# Patient Record
Sex: Male | Born: 1975 | Race: Black or African American | Hispanic: No | Marital: Single | Smoking: Never smoker
Health system: Southern US, Community
[De-identification: ages and names within clinical notes are randomized; demographics above are authoritative.]

## PROBLEM LIST (undated history)

## (undated) DIAGNOSIS — I10 Essential (primary) hypertension: Secondary | ICD-10-CM

## (undated) HISTORY — PX: HERNIA REPAIR: SHX51

---

## 2005-06-10 ENCOUNTER — Inpatient Hospital Stay (HOSPITAL_COMMUNITY): Admission: EM | Admit: 2005-06-10 | Discharge: 2005-06-11 | Payer: Self-pay | Admitting: Emergency Medicine

## 2005-06-11 ENCOUNTER — Ambulatory Visit: Payer: Self-pay | Admitting: Gastroenterology

## 2007-08-26 IMAGING — CT CT ANGIO CHEST
1 of 5 series · 19 of 30 positions shown · IV contrast (80 ml omni 300)
Comparison: none

CLINICAL DATA: Throwing up blood.  Chest pain and headache.  
 CT ANGIOGRAPHY OF CHEST:
TECHNIQUE: Multidetector CT imaging of the chest was performed during bolus injection of intravenous contrast.  Multiplanar CT angiographic image reconstructions were generated to evaluate the vascular anatomy.
 Initially, the study was performed with suboptimal contrast timing.  The patient was brought back to the scanner after creatinine was verified to be 1.0.  The patient was given an additional contrast bolus for the standard technique.  A total of 180 cc of Omnipaque 300 was given.  
 The vessels are well-opacified and there is no evidence for acute pulmonary embolus.  There is slight fullness to the region of the thymus but this is probably within normal limits for the patient?s age.  No mediastinal, hilar or axillary adenopathy identified.  No nodules, pleural effusions or infiltrates are identified.  Note is made of a granuloma in the left upper lobe.  Images of the upper abdomen are unremarkable.

[Series 2: pe · axial · 0.77mm/px · z∈[-327,-23]mm · 19 of 543 slices shown]
[im 28/543  lung]
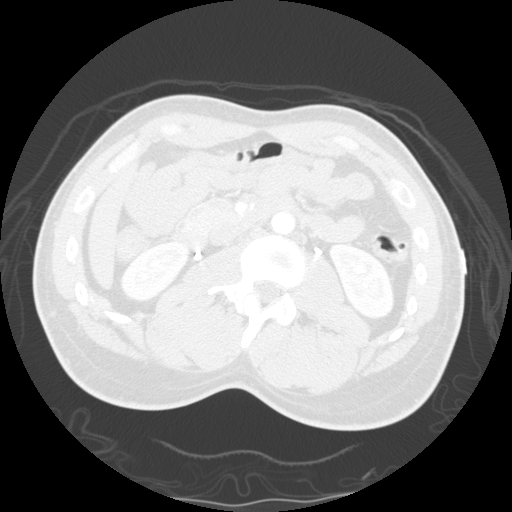
[im 55/543  mediastinal]
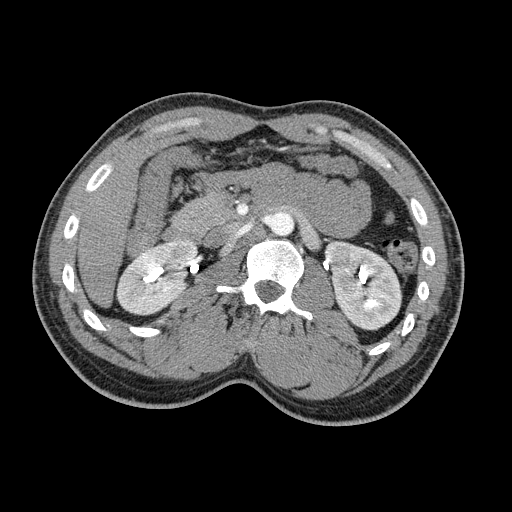
[im 82/543  lung]
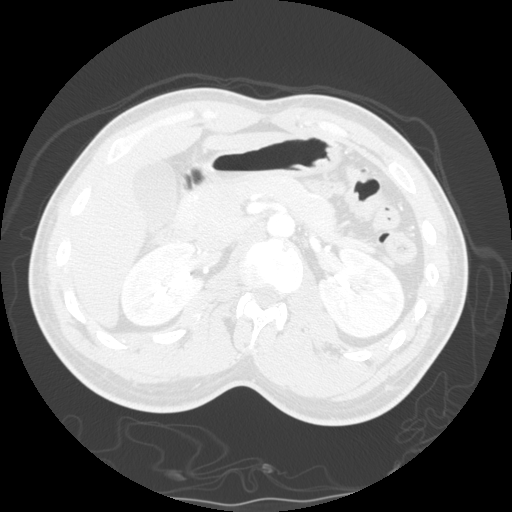
[im 109/543  mediastinal]
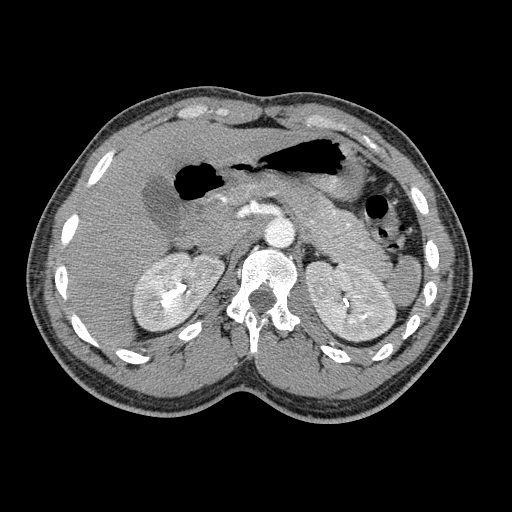
[im 136/543  lung]
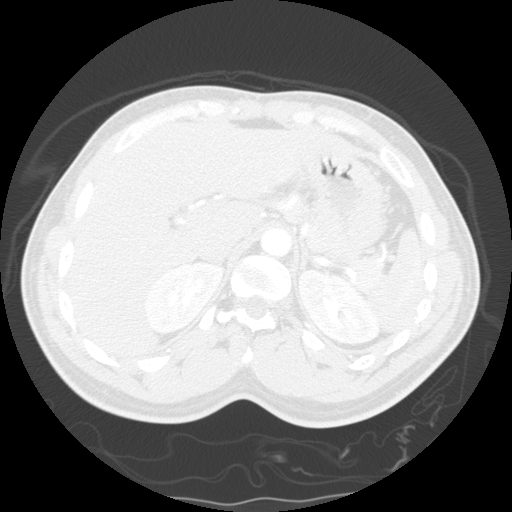
[im 163/543  mediastinal]
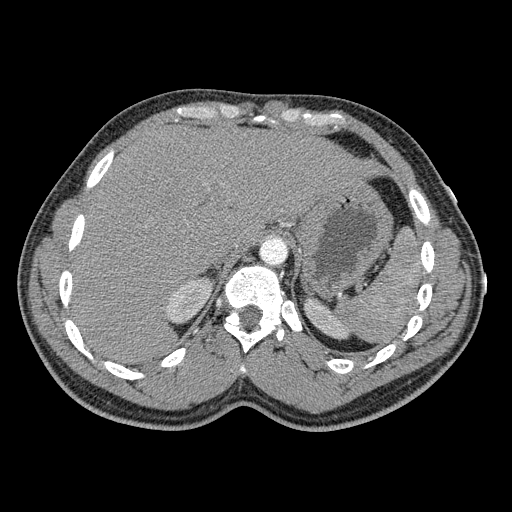
[im 190/543  lung]
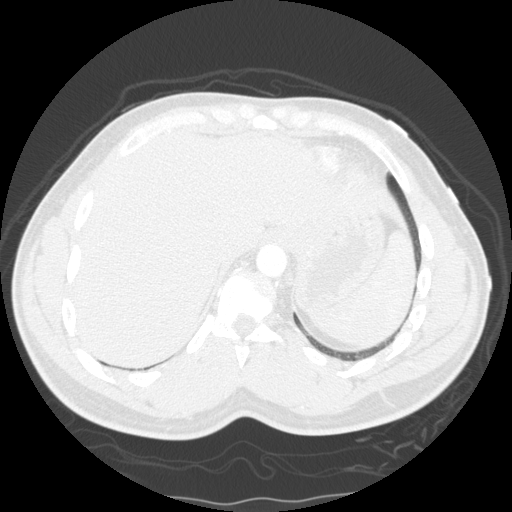
[im 217/543  mediastinal]
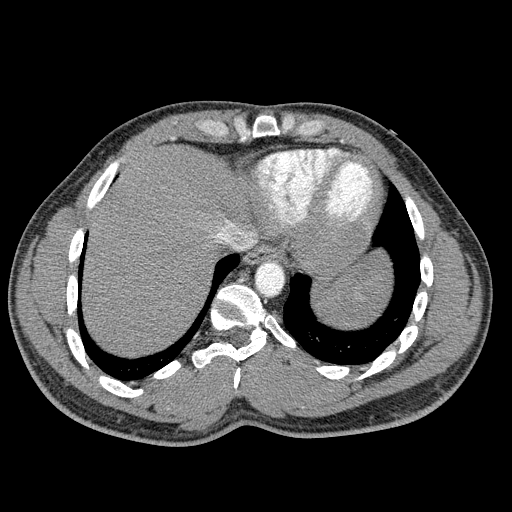
[im 244/543  lung]
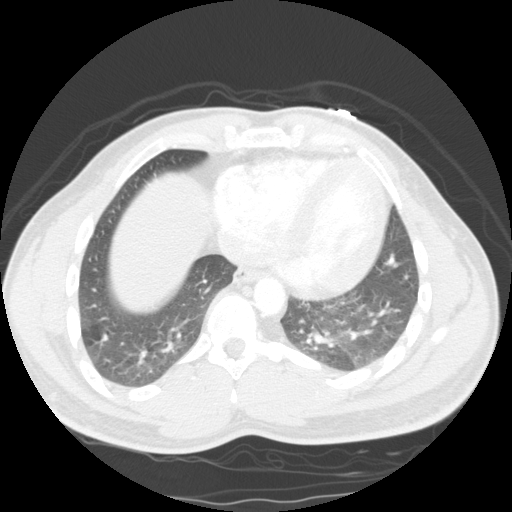
[im 272/543  mediastinal]
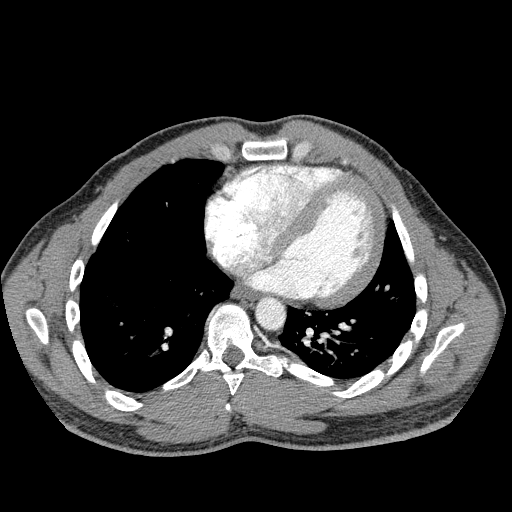
[im 299/543  lung]
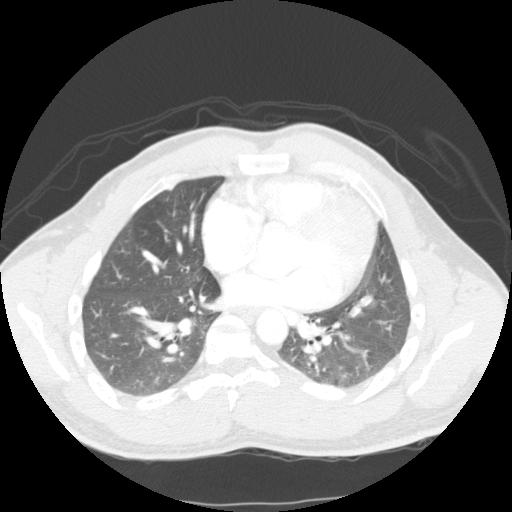
[im 326/543  mediastinal]
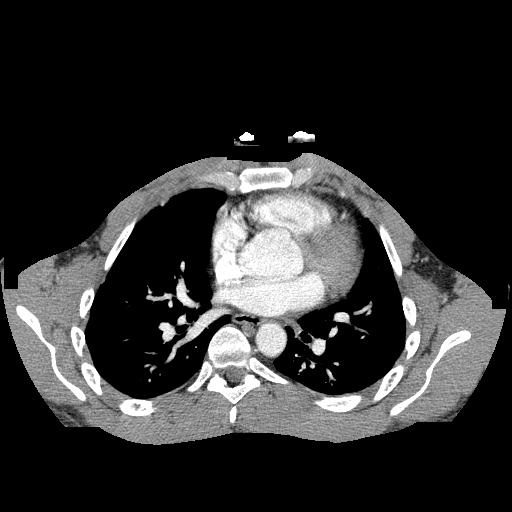
[im 353/543  lung]
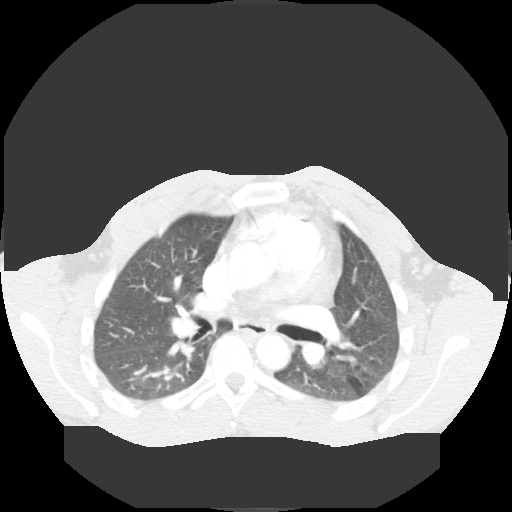
[im 380/543  mediastinal]
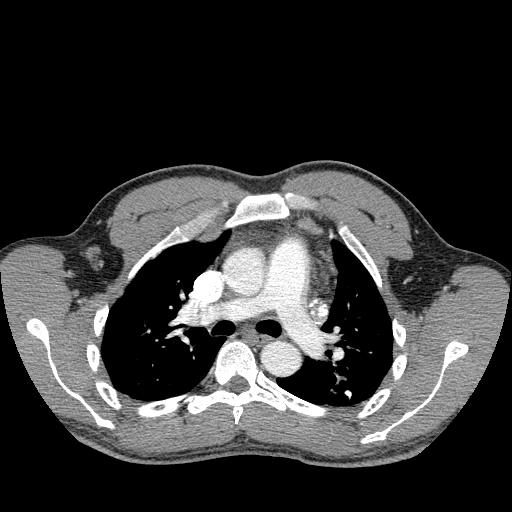
[im 407/543  lung]
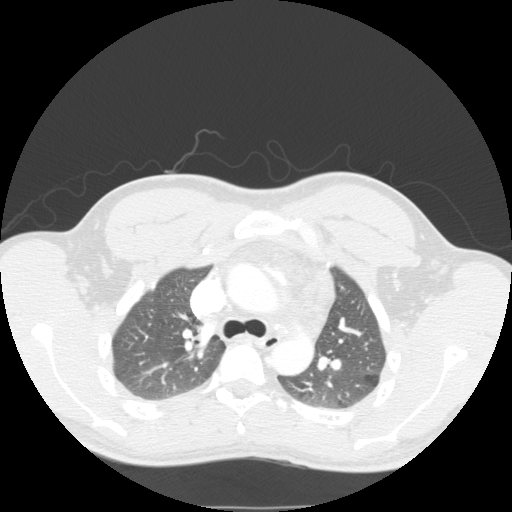
[im 434/543  mediastinal]
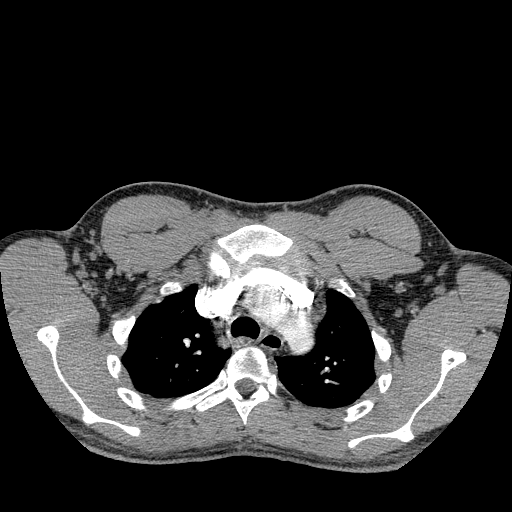
[im 461/543  lung]
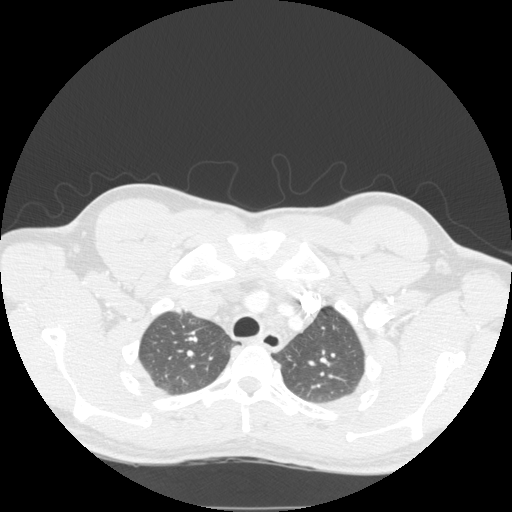
[im 488/543  mediastinal]
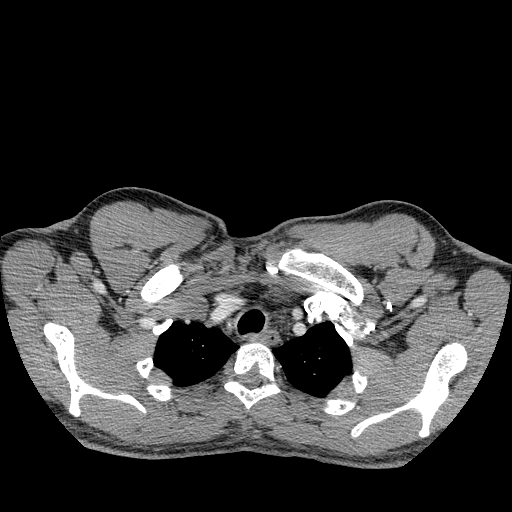
[im 515/543  lung]
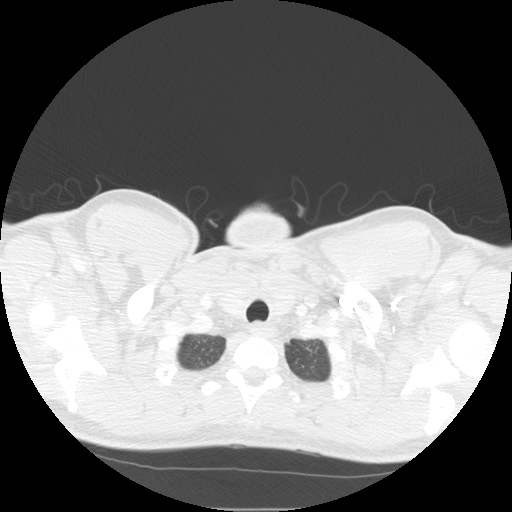

[19 of 30 positions shown; findings below may reference images not displayed]

IMPRESSION: No evidence for acute abnormality of the chest.

## 2014-01-25 ENCOUNTER — Other Ambulatory Visit (HOSPITAL_COMMUNITY): Payer: Self-pay | Admitting: Orthopedic Surgery

## 2014-01-26 ENCOUNTER — Encounter (HOSPITAL_COMMUNITY): Payer: Self-pay | Admitting: Pharmacy Technician

## 2014-01-27 ENCOUNTER — Encounter (HOSPITAL_COMMUNITY): Payer: Self-pay

## 2014-01-27 ENCOUNTER — Encounter (HOSPITAL_COMMUNITY)
Admission: RE | Admit: 2014-01-27 | Discharge: 2014-01-27 | Disposition: A | Discharge status: Home / Self Care | Payer: Worker's Compensation | Source: Ambulatory Visit | Attending: Orthopedic Surgery | Admitting: Orthopedic Surgery

## 2014-01-27 DIAGNOSIS — I10 Essential (primary) hypertension: Secondary | ICD-10-CM | POA: Diagnosis not present

## 2014-01-27 DIAGNOSIS — W1789XA Other fall from one level to another, initial encounter: Secondary | ICD-10-CM | POA: Diagnosis not present

## 2014-01-27 DIAGNOSIS — M94262 Chondromalacia, left knee: Secondary | ICD-10-CM | POA: Diagnosis not present

## 2014-01-27 DIAGNOSIS — S83512A Sprain of anterior cruciate ligament of left knee, initial encounter: Secondary | ICD-10-CM | POA: Diagnosis present

## 2014-01-27 DIAGNOSIS — S83212A Bucket-handle tear of medial meniscus, current injury, left knee, initial encounter: Secondary | ICD-10-CM | POA: Diagnosis not present

## 2014-01-27 DIAGNOSIS — Y9289 Other specified places as the place of occurrence of the external cause: Secondary | ICD-10-CM | POA: Diagnosis not present

## 2014-01-27 HISTORY — DX: Essential (primary) hypertension: I10

## 2014-01-27 LAB — CBC
HCT: 43.2 % (ref 39.0–52.0)
Hemoglobin: 14.6 g/dL (ref 13.0–17.0)
MCH: 28.9 pg (ref 26.0–34.0)
MCHC: 33.8 g/dL (ref 30.0–36.0)
MCV: 85.4 fL (ref 78.0–100.0)
Platelets: 237 10*3/uL (ref 150–400)
RBC: 5.06 MIL/uL (ref 4.22–5.81)
RDW: 13.3 % (ref 11.5–15.5)
WBC: 7.1 10*3/uL (ref 4.0–10.5)

## 2014-01-27 LAB — BASIC METABOLIC PANEL
Anion gap: 11 (ref 5–15)
BUN: 13 mg/dL (ref 6–23)
CALCIUM: 9.2 mg/dL (ref 8.4–10.5)
CO2: 25 meq/L (ref 19–32)
CREATININE: 0.96 mg/dL (ref 0.50–1.35)
Chloride: 104 mEq/L (ref 96–112)
GLUCOSE: 87 mg/dL (ref 70–99)
POTASSIUM: 4.4 meq/L (ref 3.7–5.3)
SODIUM: 140 meq/L (ref 137–147)

## 2014-01-27 MED ORDER — CHLORHEXIDINE GLUCONATE 4 % EX LIQD: 60.0000 mL | Freq: Once | CUTANEOUS | Status: DC

## 2014-01-27 MED ORDER — CEFAZOLIN SODIUM-DEXTROSE 2-3 GM-% IV SOLR
2.0000 g | INTRAVENOUS | Status: AC
  Administered 2014-01-28: 2 g via INTRAVENOUS
  Filled 2014-01-27: qty 50

## 2014-01-27 NOTE — Pre-Procedure Instructions (Signed)
Derek Brady  01/27/2014   Your procedure is scheduled on:  Thursday October 15 th at 0730 AM  [facility] at [facility]30 AM.  Call this number if you have problems the morning of surgery: [phone]   Remember:   Do not eat food or drink liquids after midnight.   Take these medicines the morning of surgery with A SIP OF WATER:  NONE   Do not wear jewelry.  Do not wear lotions, powders, or colognes. You may wear deodorant.             Men may shave face and neck.  Do not bring valuables to the hospital.  [facility] any belongings or valuables.               Contacts, dentures or bridgework may not be worn into surgery.  Leave suitcase in the car. After surgery it may be brought to your room.  For patients admitted to the hospital, discharge time is determined by your  treatment team.               Patients discharged the day of surgery will not be allowed to drive  home.    Special Instructions: [place] - Preparing for Surgery  Before surgery, you can play an important role.  Because skin is not sterile, your skin needs to be as free of germs as possible.  You can reduce the number of germs on you skin by washing with CHG (chlorahexidine gluconate) soap before surgery.  CHG is an antiseptic cleaner which kills germs and bonds with the skin to continue killing germs even after washing.  Please DO NOT use if you have an allergy to CHG or antibacterial soaps.  If your skin becomes reddened/irritated stop using the CHG and inform your nurse when you arrive at Short Stay.  Do not shave (including legs and underarms) for at least 48 hours prior to the first CHG shower.  You may shave your face.  Please follow these instructions carefully:   1.  Shower with CHG Soap the night before surgery and the                                morning of Surgery.  2.  If you choose to wash your hair, wash your hair first as usual with your        normal shampoo.  3.  After you shampoo, rinse your hair and body thoroughly to remove the                      Shampoo.  4.  Use CHG as you would any other liquid soap.  You can apply chg directly       to the skin and wash gently with scrungie or a clean washcloth.  5.  Apply the CHG Soap to your body ONLY FROM THE NECK DOWN.        Do not use on open wounds or open sores.  Avoid contact with your eyes,       ears, mouth and genitals (private parts).  Wash genitals (private parts)       with your normal soap.  6.  Wash thoroughly, paying special attention to the area where your surgery        will be performed.  7.  Thoroughly rinse your body with warm  water from the neck down.  8.  DO NOT shower/wash with your normal soap after using and rinsing off       the CHG Soap.  9.  Pat yourself dry with a clean towel.            10.  Wear clean pajamas.            11.  Place clean sheets on your bed the night of your first shower and do not        sleep with pets.  Day of Surgery  Do not apply any lotions/deoderants the morning of surgery.  Please wear clean clothes to the hospital/surgery center.      Please read over the following fact sheets that you were given: Pain Booklet, Coughing and Deep Breathing and Surgical Site Infection Prevention

## 2014-01-28 ENCOUNTER — Ambulatory Visit (HOSPITAL_COMMUNITY): Payer: Worker's Compensation | Admitting: Anesthesiology

## 2014-01-28 ENCOUNTER — Encounter (HOSPITAL_COMMUNITY)
Admission: RE | Disposition: A | Discharge status: Home / Self Care | Payer: Self-pay | Source: Ambulatory Visit | Attending: Orthopedic Surgery

## 2014-01-28 ENCOUNTER — Encounter (HOSPITAL_COMMUNITY): Payer: Self-pay | Admitting: *Deleted

## 2014-01-28 ENCOUNTER — Encounter (HOSPITAL_COMMUNITY): Payer: Worker's Compensation | Admitting: Anesthesiology

## 2014-01-28 ENCOUNTER — Observation Stay (HOSPITAL_COMMUNITY)
Admission: RE | Admit: 2014-01-28 | Discharge: 2014-01-30 | Disposition: A | Discharge status: Home / Self Care | Payer: Worker's Compensation | Source: Ambulatory Visit | Attending: Orthopedic Surgery | Admitting: Orthopedic Surgery

## 2014-01-28 DIAGNOSIS — S83212A Bucket-handle tear of medial meniscus, current injury, left knee, initial encounter: Secondary | ICD-10-CM | POA: Insufficient documentation

## 2014-01-28 DIAGNOSIS — S83512A Sprain of anterior cruciate ligament of left knee, initial encounter: Secondary | ICD-10-CM | POA: Diagnosis not present

## 2014-01-28 DIAGNOSIS — S83519A Sprain of anterior cruciate ligament of unspecified knee, initial encounter: Secondary | ICD-10-CM | POA: Diagnosis present

## 2014-01-28 DIAGNOSIS — I10 Essential (primary) hypertension: Secondary | ICD-10-CM | POA: Insufficient documentation

## 2014-01-28 DIAGNOSIS — Y9289 Other specified places as the place of occurrence of the external cause: Secondary | ICD-10-CM | POA: Insufficient documentation

## 2014-01-28 DIAGNOSIS — W1789XA Other fall from one level to another, initial encounter: Secondary | ICD-10-CM | POA: Insufficient documentation

## 2014-01-28 DIAGNOSIS — M94262 Chondromalacia, left knee: Secondary | ICD-10-CM | POA: Insufficient documentation

## 2014-01-28 HISTORY — PX: ANTERIOR CRUCIATE LIGAMENT REPAIR: SHX115

## 2014-01-28 HISTORY — PX: KNEE ARTHROSCOPY: SUR90

## 2014-01-28 SURGERY — RECONSTRUCTION, KNEE, ACL, USING HAMSTRING GRAFT
Anesthesia: General | Site: Knee | Laterality: Left

## 2014-01-28 MED ORDER — MORPHINE SULFATE 4 MG/ML IJ SOLN
INTRAMUSCULAR | Status: DC | PRN
  Administered 2014-01-28: 4 mg

## 2014-01-28 MED ORDER — PROPOFOL 10 MG/ML IV BOLUS
INTRAVENOUS | Status: DC | PRN
  Administered 2014-01-28: 200 mg via INTRAVENOUS

## 2014-01-28 MED ORDER — CLONIDINE HCL (ANALGESIA) 100 MCG/ML EP SOLN
EPIDURAL | Status: DC | PRN
  Administered 2014-01-28: 1 mL

## 2014-01-28 MED ORDER — POTASSIUM CHLORIDE IN NACL 20-0.9 MEQ/L-% IV SOLN
INTRAVENOUS | Status: AC
  Administered 2014-01-28 – 2014-01-29 (×2): via INTRAVENOUS
  Filled 2014-01-28 (×2): qty 1000

## 2014-01-28 MED ORDER — ONDANSETRON HCL 4 MG/2ML IJ SOLN
INTRAMUSCULAR | Status: DC | PRN
  Administered 2014-01-28: 4 mg via INTRAVENOUS

## 2014-01-28 MED ORDER — ONDANSETRON HCL 4 MG/2ML IJ SOLN: 4.0000 mg | Freq: Once | INTRAMUSCULAR | Status: DC | PRN

## 2014-01-28 MED ORDER — PROPOFOL 10 MG/ML IV BOLUS
INTRAVENOUS | Status: AC
  Filled 2014-01-28: qty 20

## 2014-01-28 MED ORDER — SODIUM CHLORIDE 0.9 % IR SOLN
Status: DC | PRN
  Administered 2014-01-28 (×4): 3000 mL

## 2014-01-28 MED ORDER — METOCLOPRAMIDE HCL 5 MG/ML IJ SOLN: 5.0000 mg | Freq: Three times a day (TID) | INTRAMUSCULAR | Status: DC | PRN

## 2014-01-28 MED ORDER — ESMOLOL HCL 10 MG/ML IV SOLN
INTRAVENOUS | Status: DC | PRN
  Administered 2014-01-28: 10 mg via INTRAVENOUS

## 2014-01-28 MED ORDER — OXYCODONE HCL 5 MG PO TABS: 5.0000 mg | ORAL_TABLET | Freq: Once | ORAL | Status: DC | PRN

## 2014-01-28 MED ORDER — CLONIDINE HCL (ANALGESIA) 100 MCG/ML EP SOLN
150.0000 ug | EPIDURAL | Status: DC
  Filled 2014-01-28: qty 1.5

## 2014-01-28 MED ORDER — PHENYLEPHRINE 40 MCG/ML (10ML) SYRINGE FOR IV PUSH (FOR BLOOD PRESSURE SUPPORT)
PREFILLED_SYRINGE | INTRAVENOUS | Status: AC
  Filled 2014-01-28: qty 10

## 2014-01-28 MED ORDER — CEFAZOLIN SODIUM-DEXTROSE 2-3 GM-% IV SOLR
2.0000 g | Freq: Four times a day (QID) | INTRAVENOUS | Status: AC
  Administered 2014-01-28 – 2014-01-29 (×3): 2 g via INTRAVENOUS
  Filled 2014-01-28 (×3): qty 50

## 2014-01-28 MED ORDER — METOCLOPRAMIDE HCL 10 MG PO TABS: 5.0000 mg | ORAL_TABLET | Freq: Three times a day (TID) | ORAL | Status: DC | PRN

## 2014-01-28 MED ORDER — AMLODIPINE BESYLATE 5 MG PO TABS
5.0000 mg | ORAL_TABLET | Freq: Every day | ORAL | Status: DC
  Administered 2014-01-28 – 2014-01-30 (×3): 5 mg via ORAL
  Filled 2014-01-28 (×3): qty 1

## 2014-01-28 MED ORDER — DEXTROSE 5 % IV SOLN
500.0000 mg | Freq: Four times a day (QID) | INTRAVENOUS | Status: DC | PRN
  Filled 2014-01-28: qty 5

## 2014-01-28 MED ORDER — ONDANSETRON HCL 4 MG PO TABS: 4.0000 mg | ORAL_TABLET | Freq: Four times a day (QID) | ORAL | Status: DC | PRN

## 2014-01-28 MED ORDER — MORPHINE SULFATE 4 MG/ML IJ SOLN
INTRAMUSCULAR | Status: AC
  Filled 2014-01-28: qty 1

## 2014-01-28 MED ORDER — EPHEDRINE SULFATE 50 MG/ML IJ SOLN
INTRAMUSCULAR | Status: AC
  Filled 2014-01-28: qty 1

## 2014-01-28 MED ORDER — ONDANSETRON HCL 4 MG/2ML IJ SOLN: 4.0000 mg | Freq: Four times a day (QID) | INTRAMUSCULAR | Status: DC | PRN

## 2014-01-28 MED ORDER — LABETALOL HCL 5 MG/ML IV SOLN
INTRAVENOUS | Status: AC
  Filled 2014-01-28: qty 4

## 2014-01-28 MED ORDER — SUCCINYLCHOLINE CHLORIDE 20 MG/ML IJ SOLN
INTRAMUSCULAR | Status: AC
  Filled 2014-01-28: qty 1

## 2014-01-28 MED ORDER — MIDAZOLAM HCL 2 MG/2ML IJ SOLN
INTRAMUSCULAR | Status: AC
  Filled 2014-01-28: qty 2

## 2014-01-28 MED ORDER — MIDAZOLAM HCL 5 MG/5ML IJ SOLN
INTRAMUSCULAR | Status: DC | PRN
  Administered 2014-01-28: 2 mg via INTRAVENOUS

## 2014-01-28 MED ORDER — ONDANSETRON HCL 4 MG/2ML IJ SOLN
INTRAMUSCULAR | Status: AC
  Filled 2014-01-28: qty 2

## 2014-01-28 MED ORDER — SODIUM CHLORIDE 0.9 % IJ SOLN
INTRAMUSCULAR | Status: AC
  Filled 2014-01-28: qty 10

## 2014-01-28 MED ORDER — MORPHINE SULFATE 2 MG/ML IJ SOLN
1.0000 mg | INTRAMUSCULAR | Status: DC | PRN
  Administered 2014-01-29: 1 mg via INTRAVENOUS
  Filled 2014-01-28: qty 1

## 2014-01-28 MED ORDER — 0.9 % SODIUM CHLORIDE (POUR BTL) OPTIME
TOPICAL | Status: DC | PRN
  Administered 2014-01-28: 1000 mL

## 2014-01-28 MED ORDER — ASPIRIN 325 MG PO TABS
325.0000 mg | ORAL_TABLET | Freq: Every day | ORAL | Status: DC
  Administered 2014-01-28 – 2014-01-30 (×3): 325 mg via ORAL
  Filled 2014-01-28 (×3): qty 1

## 2014-01-28 MED ORDER — FENTANYL CITRATE 0.05 MG/ML IJ SOLN
INTRAMUSCULAR | Status: AC
  Filled 2014-01-28: qty 5

## 2014-01-28 MED ORDER — BUPIVACAINE HCL (PF) 0.25 % IJ SOLN
INTRAMUSCULAR | Status: AC
  Filled 2014-01-28: qty 30

## 2014-01-28 MED ORDER — HYDROMORPHONE HCL 1 MG/ML IJ SOLN: 0.2500 mg | INTRAMUSCULAR | Status: DC | PRN

## 2014-01-28 MED ORDER — ROCURONIUM BROMIDE 50 MG/5ML IV SOLN
INTRAVENOUS | Status: AC
  Filled 2014-01-28: qty 1

## 2014-01-28 MED ORDER — HYDRALAZINE HCL 20 MG/ML IJ SOLN: 10.0000 mg | Freq: Three times a day (TID) | INTRAMUSCULAR | Status: DC | PRN

## 2014-01-28 MED ORDER — LACTATED RINGERS IV SOLN
INTRAVENOUS | Status: DC | PRN
  Administered 2014-01-28 (×2): via INTRAVENOUS

## 2014-01-28 MED ORDER — LIDOCAINE HCL (CARDIAC) 20 MG/ML IV SOLN
INTRAVENOUS | Status: AC
  Filled 2014-01-28: qty 5

## 2014-01-28 MED ORDER — FENTANYL CITRATE 0.05 MG/ML IJ SOLN
INTRAMUSCULAR | Status: DC | PRN
  Administered 2014-01-28: 25 ug via INTRAVENOUS
  Administered 2014-01-28: 100 ug via INTRAVENOUS
  Administered 2014-01-28 (×5): 25 ug via INTRAVENOUS

## 2014-01-28 MED ORDER — OXYCODONE HCL 5 MG PO TABS
5.0000 mg | ORAL_TABLET | ORAL | Status: DC | PRN
  Administered 2014-01-29: 10 mg via ORAL
  Administered 2014-01-29: 5 mg via ORAL
  Administered 2014-01-29: 10 mg via ORAL
  Administered 2014-01-29: 5 mg via ORAL
  Administered 2014-01-30 (×3): 10 mg via ORAL
  Filled 2014-01-28: qty 1
  Filled 2014-01-28 (×2): qty 2
  Filled 2014-01-28: qty 1
  Filled 2014-01-28 (×3): qty 2

## 2014-01-28 MED ORDER — LIDOCAINE HCL (CARDIAC) 20 MG/ML IV SOLN
INTRAVENOUS | Status: DC | PRN
  Administered 2014-01-28: 60 mg via INTRAVENOUS

## 2014-01-28 MED ORDER — CHLORHEXIDINE GLUCONATE 4 % EX LIQD
60.0000 mL | Freq: Once | CUTANEOUS | Status: DC
  Filled 2014-01-28: qty 60

## 2014-01-28 MED ORDER — OXYCODONE HCL 5 MG/5ML PO SOLN: 5.0000 mg | Freq: Once | ORAL | Status: DC | PRN

## 2014-01-28 MED ORDER — BUPIVACAINE HCL (PF) 0.25 % IJ SOLN
INTRAMUSCULAR | Status: DC | PRN
  Administered 2014-01-28: 15 mL

## 2014-01-28 MED ORDER — METHOCARBAMOL 500 MG PO TABS
500.0000 mg | ORAL_TABLET | Freq: Four times a day (QID) | ORAL | Status: DC | PRN
  Administered 2014-01-28 – 2014-01-30 (×4): 500 mg via ORAL
  Filled 2014-01-28 (×4): qty 1

## 2014-01-28 SURGICAL SUPPLY — 91 items
ANCHOR BUTTON TIGHTROPE ACL RT (Orthopedic Implant) ×4 IMPLANT
BANDAGE ELASTIC 6 VELCRO ST LF (GAUZE/BANDAGES/DRESSINGS) ×3 IMPLANT
BANDAGE ESMARK 6X9 LF (GAUZE/BANDAGES/DRESSINGS) ×1 IMPLANT
BIT DRILL STEP 3.5 (DRILL) ×1 IMPLANT
BLADE CUDA 5.5 (BLADE) ×2 IMPLANT
BLADE CUTTER GATOR 3.5 (BLADE) ×2 IMPLANT
BLADE GREAT WHITE 4.2 (BLADE) ×2 IMPLANT
BLADE GREAT WHITE 4.2MM (BLADE) ×1
BLADE SURG 10 STRL SS (BLADE) ×3 IMPLANT
BLADE SURG 15 STRL LF DISP TIS (BLADE) ×2 IMPLANT
BLADE SURG 15 STRL SS (BLADE) ×6
BNDG CMPR 9X6 STRL LF SNTH (GAUZE/BANDAGES/DRESSINGS) ×1
BNDG CMPR MED 10X6 ELC LF (GAUZE/BANDAGES/DRESSINGS) ×1
BNDG CMPR MED 15X6 ELC VLCR LF (GAUZE/BANDAGES/DRESSINGS) ×1
BNDG ELASTIC 6X10 VLCR STRL LF (GAUZE/BANDAGES/DRESSINGS) ×2 IMPLANT
BNDG ELASTIC 6X15 VLCR STRL LF (GAUZE/BANDAGES/DRESSINGS) ×3 IMPLANT
BNDG ESMARK 6X9 LF (GAUZE/BANDAGES/DRESSINGS) ×3
BONE MATRIX DEMINERALIZED 1CC (Bone Implant) ×4 IMPLANT
BUR OVAL 6.0 (BURR) ×3 IMPLANT
CLOSURE STERI-STRIP 1/2X4 (GAUZE/BANDAGES/DRESSINGS) ×1
CLSR STERI-STRIP ANTIMIC 1/2X4 (GAUZE/BANDAGES/DRESSINGS) ×1 IMPLANT
COVER SURGICAL LIGHT HANDLE (MISCELLANEOUS) ×3 IMPLANT
CUFF TOURNIQUET SINGLE 34IN LL (TOURNIQUET CUFF) IMPLANT
CUFF TOURNIQUET SINGLE 44IN (TOURNIQUET CUFF) IMPLANT
CUTTER FLIP 10MM (CUTTER) ×2 IMPLANT
DECANTER SPIKE VIAL GLASS SM (MISCELLANEOUS) ×1 IMPLANT
DRAPE ARTHROSCOPY W/POUCH 114 (DRAPES) ×3 IMPLANT
DRAPE INCISE IOBAN 66X45 STRL (DRAPES) ×3 IMPLANT
DRAPE U-SHAPE 47X51 STRL (DRAPES) ×3 IMPLANT
DRILL STEP 3.5 (DRILL) ×3
DRSG PAD ABDOMINAL 8X10 ST (GAUZE/BANDAGES/DRESSINGS) ×5 IMPLANT
ELECT REM PT RETURN 9FT ADLT (ELECTROSURGICAL) ×3
ELECTRODE REM PT RTRN 9FT ADLT (ELECTROSURGICAL) ×1 IMPLANT
EVACUATOR 1/8 PVC DRAIN (DRAIN) IMPLANT
FIBERSTICK 2 (SUTURE) ×2 IMPLANT
GAUZE SPONGE 4X4 12PLY STRL (GAUZE/BANDAGES/DRESSINGS) ×6 IMPLANT
GAUZE XEROFORM 1X8 LF (GAUZE/BANDAGES/DRESSINGS) ×6 IMPLANT
GLOVE BIOGEL PI IND STRL 7.5 (GLOVE) ×1 IMPLANT
GLOVE BIOGEL PI IND STRL 8 (GLOVE) ×1 IMPLANT
GLOVE BIOGEL PI INDICATOR 7.5 (GLOVE) ×2
GLOVE BIOGEL PI INDICATOR 8 (GLOVE) ×2
GLOVE ECLIPSE 7.0 STRL STRAW (GLOVE) ×3 IMPLANT
GLOVE SURG ORTHO 8.0 STRL STRW (GLOVE) ×3 IMPLANT
GOWN STRL REUS W/ TWL LRG LVL3 (GOWN DISPOSABLE) ×4 IMPLANT
GOWN STRL REUS W/ TWL XL LVL3 (GOWN DISPOSABLE) ×1 IMPLANT
GOWN STRL REUS W/TWL LRG LVL3 (GOWN DISPOSABLE) ×12
GOWN STRL REUS W/TWL XL LVL3 (GOWN DISPOSABLE) ×3
IMMOBILIZER KNEE 22 (SOFTGOODS) ×2 IMPLANT
KIT BASIN OR (CUSTOM PROCEDURE TRAY) ×3 IMPLANT
KIT ROOM TURNOVER OR (KITS) ×3 IMPLANT
MANIFOLD NEPTUNE II (INSTRUMENTS) ×3 IMPLANT
NDL 18GX1X1/2 (RX/OR ONLY) (NEEDLE) ×1 IMPLANT
NEEDLE 18GX1X1/2 (RX/OR ONLY) (NEEDLE) ×3 IMPLANT
NS IRRIG 1000ML POUR BTL (IV SOLUTION) ×3 IMPLANT
PACK ARTHROSCOPY DSU (CUSTOM PROCEDURE TRAY) ×3 IMPLANT
PAD ARMBOARD 7.5X6 YLW CONV (MISCELLANEOUS) ×6 IMPLANT
PAD CAST 4YDX4 CTTN HI CHSV (CAST SUPPLIES) ×1 IMPLANT
PADDING CAST COTTON 4X4 STRL (CAST SUPPLIES) ×3
PADDING CAST COTTON 6X4 STRL (CAST SUPPLIES) ×5 IMPLANT
PENCIL BUTTON HOLSTER BLD 10FT (ELECTRODE) ×3 IMPLANT
REAMER C 10MM (INSTRUMENTS) IMPLANT
SET ARTHROSCOPY TUBING (MISCELLANEOUS) ×3
SET ARTHROSCOPY TUBING LN (MISCELLANEOUS) ×1 IMPLANT
SPONGE GAUZE 4X4 12PLY STER LF (GAUZE/BANDAGES/DRESSINGS) ×2 IMPLANT
SPONGE LAP 4X18 X RAY DECT (DISPOSABLE) ×6 IMPLANT
SPONGE SCRUB IODOPHOR (GAUZE/BANDAGES/DRESSINGS) ×3 IMPLANT
SUCTION FRAZIER TIP 10 FR DISP (SUCTIONS) ×3 IMPLANT
SUT 2 FIBERLOOP 20 STRT BLUE (SUTURE) ×3
SUT ETHILON 3 0 PS 1 (SUTURE) ×8 IMPLANT
SUT FIBERWIRE #2 38 T-5 BLUE (SUTURE) ×9
SUT MENISCAL KIT (KITS) IMPLANT
SUT PROLENE 3 0 PS 2 (SUTURE) ×3 IMPLANT
SUT VIC AB 0 CT1 27 (SUTURE) ×9
SUT VIC AB 0 CT1 27XBRD ANBCTR (SUTURE) ×1 IMPLANT
SUT VIC AB 2-0 CT1 27 (SUTURE) ×9
SUT VIC AB 2-0 CT1 TAPERPNT 27 (SUTURE) ×1 IMPLANT
SUT VICRYL 0 TIES 12 18 (SUTURE) IMPLANT
SUTURE 2 FIBERLOOP 20 STRT BLU (SUTURE) ×3 IMPLANT
SUTURE FIBERWR #2 38 T-5 BLUE (SUTURE) ×1 IMPLANT
SUTURE TIGERSTICK 2 TIGERWIR 2 (MISCELLANEOUS) ×1 IMPLANT
SYR 30ML LL (SYRINGE) ×3 IMPLANT
SYR 30ML SLIP (SYRINGE) ×3 IMPLANT
SYR BULB IRRIGATION 50ML (SYRINGE) ×3 IMPLANT
SYR TB 1ML LUER SLIP (SYRINGE) ×3 IMPLANT
TIGERSTICK 2 TIGERWIRE 2 (MISCELLANEOUS) ×3
TOWEL OR 17X24 6PK STRL BLUE (TOWEL DISPOSABLE) ×3 IMPLANT
TOWEL OR 17X26 10 PK STRL BLUE (TOWEL DISPOSABLE) ×3 IMPLANT
UNDERPAD 30X30 INCONTINENT (UNDERPADS AND DIAPERS) ×3 IMPLANT
WAND HAND CNTRL MULTIVAC 90 (MISCELLANEOUS) ×1 IMPLANT
WATER STERILE IRR 1000ML POUR (IV SOLUTION) ×1 IMPLANT
WRAP KNEE MAXI GEL POST OP (GAUZE/BANDAGES/DRESSINGS) ×2 IMPLANT

## 2014-01-28 NOTE — Anesthesia Procedure Notes (Signed)
Procedure Name: LMA Insertion Date/Time: 01/28/2014 7:42 AM Performed by: Jerilee HohMUMM, Spencer Cardinal N Pre-anesthesia Checklist: Patient identified, Emergency Drugs available, Suction available and Patient being monitored Patient Re-evaluated:Patient Re-evaluated prior to inductionOxygen Delivery Method: Circle system utilized Preoxygenation: Pre-oxygenation with 100% oxygen Intubation Type: IV induction Ventilation: Mask ventilation without difficulty LMA: LMA inserted LMA Size: 5.0 Tube type: Oral Number of attempts: 1 Placement Confirmation: positive ETCO2 and breath sounds checked- equal and bilateral Tube secured with: Tape Dental Injury: Teeth and Oropharynx as per pre-operative assessment

## 2014-01-28 NOTE — Progress Notes (Signed)
Orthopedic Tech Progress Note Patient Details:  Derek Brady 05/04/1975 [id number]  Patient ID: Derek Brady, male   DOB: 09/16/1975, 38 y.o.   MRN: [id number] Placed pt's lle in cpm @ 0-45 degrees @ 1325  Rawad Bochicchio 01/28/2014, 3:35 PM

## 2014-01-28 NOTE — Consult Note (Signed)
[facility] Medical Consultation  Paulita CradleGary Huot YNW:[id number]:[id number] DOB: 12/12/1975 DOA: 01/28/2014 PCP: Pcp Not In System   Requesting physician: Dr Dorene GrebeScott Dean.  Date of consultation: 01-28-2014 Reason for consultation: HTN.   Impression/Recommendations Active Problems:   ACL tear   HTN (hypertension), benign    1. HTN; new diagnosis for patient. He denies prior history of HTN. His BP on admission was found to be elevated at 152/111. He was taking aleve for pain. I have advised him not to take alive. I will start low dose Norvasc starting 10-16. His BP is very high to be only related to pain.  Will order PRN hydralazine. He received clonidine today in the OR. He will need close follow up with PCP and further assess need for BP medications. Will ask CM to arrange for a PCP.  2. Left Knee ACL tear , medial meniscal tear ; SP surgery on 10/15. DVT prophylaxis per ortho.   I will followup again tomorrow. Please contact me if I can be of assistance in the meanwhile. Thank you for this consultation.  Chief Complaint: left knee pain.   HPI: 38 year old with no significant PMH presents for elective surgery of left knee ACL tear and medial meniscal tear. Patient was found to have elevated BP in the 150/110 range. He denies chest pain, dyspnea. No abdominal pain. He has been taking aleve for pain since he fell. He denies prior history of HTN.    Review of Systems:  Negative, except as per HPI.   Past Medical History  Diagnosis Date  . Hypertension     BP elevated because of pain   Past Surgical History  Procedure Laterality Date  . Hernia repair      umbilical as a child   Social History:  reports that he has never smoked. He has never used smokeless tobacco. He reports that he does not drink alcohol or use illicit drugs.  No Known Allergies Family history: father died of lung cancer.   Prior to Admission medications   Medication Sig Start Date End Date Taking? Authorizing Provider   naproxen sodium (ANAPROX) 220 MG tablet Take 440 mg by mouth 2 (two) times daily as needed (for pain).   Yes Historical Provider, MD   Physical Exam: Blood pressure 147/91, pulse 84, temperature 97.6 F (36.4 C), temperature source Oral, resp. rate 23, height 6' 2.5" (1.892 m), weight 117.028 kg (258 lb), SpO2 98.00%. Filed Vitals:   01/28/14 1315  BP:   Pulse: 84  Temp: 97.6 F (36.4 C)  Resp: 23   General:  Appears calm and comfortable Eyes: PERRL, normal lids, irises & conjunctiva ENT: grossly normal hearing, lips & tongue Neck: no LAD, masses or thyromegaly Cardiovascular: RRR, no m/r/g. No LE edema. Respiratory: CTA bilaterally, no w/r/r. Normal respiratory effort. Abdomen: soft, ntnd Skin: no rash or induration seen on limited exam Musculoskeletal: left extremity with dressing  Psychiatric: grossly normal mood and affect, speech fluent and appropriate Neurologic: grossly non-focal.    Labs on Admission:  Basic Metabolic Panel:  Recent Labs Lab 01/27/14 1243  NA 140  K 4.4  CL 104  CO2 25  GLUCOSE 87  BUN 13  CREATININE 0.96  CALCIUM 9.2   Liver Function Tests: No results found for this basename: AST, ALT, ALKPHOS, BILITOT, PROT, ALBUMIN,  in the last 168 hours No results found for this basename: LIPASE, AMYLASE,  in the last 168 hours No results found for this basename: AMMONIA,  in the  last 168 hours CBC:  Recent Labs Lab 01/27/14 1243  WBC 7.1  HGB 14.6  HCT 43.2  MCV 85.4  PLT 237   Cardiac Enzymes: No results found for this basename: CKTOTAL, CKMB, CKMBINDEX, TROPONINI,  in the last 168 hours BNP: No components found with this basename: POCBNP,  CBG: No results found for this basename: GLUCAP,  in the last 168 hours  Radiological Exams on Admission: No results found.  EKG: Independently reviewed.   Time spent: 75 minutes.   Hartley BarefootRegalado, Andrus Sharp A [facility] Pager [phone]  If 7PM-7AM, please contact  night-coverage [email or web address] Password [email or web address] 01/28/2014, 4:58 PM

## 2014-01-28 NOTE — Anesthesia Postprocedure Evaluation (Signed)
  Anesthesia Post-op Note  Patient: Derek CradleGary Brady  Procedure(s) Performed: Procedure(s) with comments: LEFT KNEE ARTHROSCOPY, ANTERIOR CRUCIATE LIGAMENT RECONSTRUCTION WITH HAMSTRING AUTOGRAFT, MEDIAL MENISCAL REPAIR VERSES DEBRIDEMENT. (Left) - LEFT KNEE ARTHROSCOPY, ANTERIOR CRUCIATE LIGAMENT RECONSTRUCTION WITH HAMSTRING AUTOGRAFT, MEDIAL MENISCAL REPAIR VERSES DEBRIDEMENT.  Patient Location: PACU  Anesthesia Type:General and GA combined with regional for post-op pain  Level of Consciousness: awake, alert  and oriented  Airway and Oxygen Therapy: Patient Spontanous Breathing and Patient connected to nasal cannula oxygen  Post-op Pain: mild  Post-op Assessment: Post-op Vital signs reviewed, Patient's Cardiovascular Status Stable, Respiratory Function Stable, Patent Airway and Pain level controlled  Post-op Vital Signs: stable  Last Vitals:  Filed Vitals:   01/28/14 1315  BP:   Pulse: 84  Temp:   Resp: 23    Complications: No apparent anesthesia complications

## 2014-01-28 NOTE — Op Note (Signed)
NAME:  Derek Brady, Derek Brady                 ACCOUNT NO.:  [id number]  MEDICAL RECORD NO.:  [id number]  LOCATION:  [id number]                        FACILITY:  [facility]  PHYSICIAN:  Burnard BuntingG. Scott Dean, M.D.    DATE OF BIRTH:  05-Nov-1975  DATE OF PROCEDURE: DATE OF DISCHARGE:                              OPERATIVE REPORT   PREOPERATIVE DIAGNOSES:  Left knee ACL tear, medial meniscal tear.  POSTOPERATIVE DIAGNOSES:  Left knee ACL tear, medial meniscal tear.  PROCEDURE:  Left knee ACL reconstruction, hamstring autograft, size 10, partial medial meniscectomy.  SURGEON:  Burnard BuntingG. Scott Dean, M.D.  ASSISTANT:  Skip MayerBlair Roberts, PA.  ANESTHESIA:  General.  INDICATIONS:  Derek Brady is a patient with left knee pain, locked meniscal tear, presents for operative management of his meniscal tear and ACL tear after explanation of risks and benefits.  OPERATIVE FINDINGS: 1. Examination under anesthesia.  He has a range of motion about 5-135     degrees and stability in varus-valgus stress.  ACL out.  PCL     intact.  No posterolateral rotatory instability is noted. 2. Diagnostic arthroscopy.     a.     Intact patellofemoral compartment.     b.     Intact lateral compartment, articular cartilage, and      meniscus.     c.     Torn ACL, intact PCL.     d.     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      The patient also had an irreparable bucket handle type tear of the      medial meniscus which was flipped into the front part of the      joint.  The meniscus has actually already started to heal in its      anterior portion to the anterior portion of the central tibial      plateau.  PROCEDURE IN DETAIL:  The patient was brought to the operating room where general anesthesia was induced.  Preoperative antibiotics were administered.  Time-out was called.  Left leg was examined under anesthesia.  Leg was prescrubbed with alcohol and  Betadine, allowed to air dry, prepped with DuraPrep solution and draped in sterile manner. Ioban used to cover the operative field.  Time-out was called.  Leg was elevated and exsanguinated with an Esmarch wrap.  Tourniquet was inflated.  Anterior inferolateral was created.  Anterior, inferior, medial portals were created in direct visualization.  Diagnostic arthroscopy was performed.  The patient had an irreparable medial meniscal tear which was debrided back to a stable rim.  All about 60% of the meniscus was affected.  Following meniscal debridement, joint surface was inspected.  The medial femoral condyle did have an area of degenerative chondromalacia grade 3, transitioning to grade 4 of about 20% of the weightbearing surface area of the medial femoral condyle. Following meniscal debridement and chondral debridement, the chondral lesion was not a traumatic lesion, more degenerative in nature.  Not felt to be really amenable to microfracture.  At this time, following meniscal debridement, ACL  stump debrided.  Notchplasty performed. Lateral compartment was inspected and found to be intact.  The patellofemoral joint was also intact.  At this time, the graft was prepared, the hamstring graft was harvested which was done actually prior to arthroscopic intervention.  Hamstring was prepared on the back table using double EndoButton technique by Skip MayerBlair Roberts.  Graft was size 10.  Following graft harvest and preparation, 10 mm tunnel drilled in the 3 o'clock position on the lateral femoral condyle.  Tibial tunnel was then also drilled using 10 mm flip cutter.  Graft was then passed and secured into tunnels, filled with StimuBlast, tightened in extension.  Excellent graft stability was achieved.  Good fixation was present.  Knee joint thoroughly irrigated. Tourniquet was released.  Solution of Marcaine, morphine, clonidine injected into the knee.  The incision was then closed using  a combination of interrupted, inverted 0 Vicryl suture, 2-0 Vicryl suture, and either 3-0 Prolene or 3-0 nylon.  Bulky knee dressing applied.  The patient tolerated the procedure well without any immediate complication, transferred to recovery room in stable condition.  Carlena SaxBlair Roberts's assistance required at all times for preparation, opening, closing, drilling the tunnels.  His assistance was medical necessity.     Burnard BuntingG. Scott Dean, M.D.     GSD/MEDQ  D:  01/28/2014  T:  01/28/2014  Job:  [id number]

## 2014-01-28 NOTE — Transfer of Care (Signed)
Immediate Anesthesia Transfer of Care Note  Patient: Derek Brady  Procedure(s) Performed: Procedure(s) with comments: LEFT KNEE ARTHROSCOPY, ANTERIOR CRUCIATE LIGAMENT RECONSTRUCTION WITH HAMSTRING AUTOGRAFT, MEDIAL MENISCAL REPAIR VERSES DEBRIDEMENT. (Left) - LEFT KNEE ARTHROSCOPY, ANTERIOR CRUCIATE LIGAMENT RECONSTRUCTION WITH HAMSTRING AUTOGRAFT, MEDIAL MENISCAL REPAIR VERSES DEBRIDEMENT.  Patient Location: PACU  Anesthesia Type:GA combined with regional for post-op pain  Level of Consciousness: awake, alert , oriented and patient cooperative  Airway & Oxygen Therapy: Patient Spontanous Breathing and Patient connected to face mask oxygen  Post-op Assessment: Report given to PACU RN, Post -op Vital signs reviewed and stable and Patient moving all extremities  Post vital signs: Reviewed and stable  Complications: No apparent anesthesia complications

## 2014-01-28 NOTE — Progress Notes (Addendum)
Flow manager number called for consult for pt's BP [phone] paged out to Dr Sunnie Nielsenegalado

## 2014-01-28 NOTE — Evaluation (Signed)
Physical Therapy Evaluation Patient Details Name: Derek Brady MRN: [id number] DOB: 06/03/1975 Today's Date: 01/28/2014   History of Present Illness  38 y.o. male s/p LEFT KNEE ARTHROSCOPY, ANTERIOR CRUCIATE LIGAMENT RECONSTRUCTION WITH HAMSTRING AUTOGRAFT, MEDIAL MENISCAL REPAIR VERSES DEBRIDEMENT.  hx of HTN.  Clinical Impression  Patient is seen following the above procedure and presents with functional limitations due to the deficits listed below (see PT Problem List). Ambulates up to 25 feet post op day #0 with min guard assist while safely maintaining PWB status on LLE. Reviewed safety with mobility and plan for stair training in AM as pt fatigued and was mildly nauseated at end of therapy session today. Patient will benefit from skilled PT to increase their independence and safety with mobility to allow discharge to the venue listed below.       Follow Up Recommendations Supervision for mobility/OOB    Equipment Recommendations  Rolling walker with 5" wheels;3in1 (PT)    Recommendations for Other Services       Precautions / Restrictions Precautions Precautions: Knee Required Braces or Orthoses: Knee Immobilizer - Left Restrictions Weight Bearing Restrictions: Yes LLE Weight Bearing: Partial weight bearing LLE Partial Weight Bearing Percentage or Pounds: Unspecified - maintain TDWB for now      Mobility  Bed Mobility Overal bed mobility: Needs Assistance Bed Mobility: Supine to Sit;Sit to Supine     Supine to sit: Supervision Sit to supine: Supervision   General bed mobility comments: Supervision for safety. VC for technique. No physical assist requried.   Transfers Overall transfer level: Needs assistance Equipment used: Rolling walker (2 wheeled) Transfers: Sit to/from Stand Sit to Stand: Min guard;From elevated surface         General transfer comment: Min guard for safety with VC for hand and foot placement. Performed from elevated bed surface similar to  home environment and from low reclining chair. Cues for WB status on LLE when rising from reclining chair.  Ambulation/Gait Ambulation/Gait assistance: Min guard Ambulation Distance (Feet): 25 Feet Assistive device: Rolling walker (2 wheeled) Gait Pattern/deviations: Step-to pattern;Decreased step length - right;Decreased stance time - left;Antalgic;Trunk flexed   Gait velocity interpretation: Below normal speed for age/gender General Gait Details: Educated on safe DME use with a rolling walker. VC for sequencing and use of UEs to maintain WB status on LLE. Pt was able to safely maintain a TDWB status during this therapy session. No loss of balance. Distance limited somewhat by nausea and fatigue.  Stairs            Wheelchair Mobility    Modified Rankin (Stroke Patients Only)       Balance Overall balance assessment: Needs assistance Sitting-balance support: No upper extremity supported;Feet supported Sitting balance-Leahy Scale: Good     Standing balance support: No upper extremity supported Standing balance-Leahy Scale: Fair                               Pertinent Vitals/Pain Pain Assessment: 0-10 Pain Score: 0-No pain Pain Location: Lt knee Pain Descriptors / Indicators: Numbness Pain Intervention(s): Monitored during session;Repositioned    Home Living Family/patient expects to be discharged to:: Private residence Living Arrangements: Alone Available Help at Discharge: Family;Available 24 hours/day Type of Home: House Home Access: Stairs to enter Entrance Stairs-Rails: Doctor, general practiceight;Left Entrance Stairs-Number of Steps: 5 Home Layout: One level Home Equipment: None      Prior Function Level of Independence: Independent  Hand Dominance   Dominant Hand: Right    Extremity/Trunk Assessment   Upper Extremity Assessment: Defer to OT evaluation           Lower Extremity Assessment: LLE deficits/detail   LLE Deficits /  Details: Decreased strength and ROM as expected post op     Communication   Communication: No difficulties  Cognition Arousal/Alertness: Awake/alert Behavior During Therapy: WFL for tasks assessed/performed Overall Cognitive Status: Within Functional Limits for tasks assessed                      General Comments General comments (skin integrity, edema, etc.): Reviewed safety with mobility, precautions, use of CPM and knee immobilizer.    Exercises General Exercises - Lower Extremity Ankle Circles/Pumps: AROM;Both;10 reps;Seated      Assessment/Plan    PT Assessment Patient needs continued PT services  PT Diagnosis Difficulty walking;Abnormality of gait;Acute pain   PT Problem List Decreased strength;Decreased range of motion;Decreased activity tolerance;Decreased balance;Decreased mobility;Decreased knowledge of use of DME;Decreased knowledge of precautions;Impaired sensation;Pain  PT Treatment Interventions DME instruction;Gait training;Stair training;Functional mobility training;Therapeutic activities;Therapeutic exercise;Balance training;Neuromuscular re-education;Patient/family education;Modalities   PT Goals (Current goals can be found in the Care Plan section) Acute Rehab PT Goals Patient Stated Goal: back to work PT Goal Formulation: With patient Time For Goal Achievement: 02/04/14 Potential to Achieve Goals: Good    Frequency Min 5X/week   Barriers to discharge        Co-evaluation               End of Session Equipment Utilized During Treatment: Gait belt;Left knee immobilizer Activity Tolerance: Other (comment);Patient limited by fatigue (Pt with some nausea no emesis) Patient left: in bed;in CPM;with call bell/phone within reach;with nursing/sitter in room Nurse Communication: Mobility status;Weight bearing status    Functional Assessment Tool Used: clinical observation Functional Limitation: Mobility: Walking and moving around Mobility:  Walking and Moving Around Current Status ([id number]): At least 20 percent but less than 40 percent impaired, limited or restricted Mobility: Walking and Moving Around Goal Status [phone]([id number]): At least 1 percent but less than 20 percent impaired, limited or restricted    Time: 1446-1531 PT Time Calculation (min): 45 min   Charges:   PT Evaluation $Initial PT Evaluation Tier I: 1 Procedure PT Treatments $Therapeutic Activity: 8-22 mins $Self Care/Home Management: 8-22   PT G Codes:   Functional Assessment Tool Used: clinical observation Functional Limitation: Mobility: Walking and moving around   [facility] [place], [place] [phone]  [place] Mount[place], Shauntay Brunelli S 01/28/2014, 3:47 PM

## 2014-01-28 NOTE — Brief Op Note (Signed)
01/28/2014  11:08 AM  PATIENT:  Paulita CradleGary Suderman  38 y.o. male  PRE-OPERATIVE DIAGNOSIS:  LEFT KNEE ACL TEAR, MEDIAL MENISCAL TEAR  POST-OPERATIVE DIAGNOSIS:  LEFT KNEE ACL TEAR, MEDIAL MENISCAL TEAR  PROCEDURE:  Procedure(s): LEFT KNEE ARTHROSCOPY, ANTERIOR CRUCIATE LIGAMENT RECONSTRUCTION WITH HAMSTRING AUTOGRAFT, MEDIAL MENISCAL  DEBRIDEMENT.  SURGEON:  Surgeon(s): Cammy CopaGregory Scott Zenita Kister, MD  ASSISTANT: b roberts pa  ANESTHESIA:   general  EBL: 50 ml    Total I/O In: 1700 [I.V.:1700] Out: 50 [Blood:50]  BLOOD ADMINISTERED: none  DRAINS: none   LOCAL MEDICATIONS USED:  Marcaine mso4 clonidine  SPECIMEN:  No Specimen  COUNTS:  YES  TOURNIQUET:  * Missing tourniquet times found for documented tourniquets in log:  [id number] *  DICTATION: .Other Dictation: Dictation Number [id number]  PLAN OF CARE: Admit for overnight observation  PATIENT DISPOSITION:  PACU - hemodynamically stable

## 2014-01-28 NOTE — Anesthesia Preprocedure Evaluation (Signed)
Anesthesia Evaluation  Patient identified by MRN, date of birth, ID band Patient awake    Reviewed: Allergy & Precautions, H&P , NPO status , Patient's Chart, lab work & pertinent test results  Airway Mallampati: II TM Distance: >3 FB Neck ROM: Full    Dental  (+) Teeth Intact,    Pulmonary  breath sounds clear to auscultation        Cardiovascular hypertension, Rhythm:Regular Rate:Normal     Neuro/Psych    GI/Hepatic   Endo/Other    Renal/GU      Musculoskeletal   Abdominal   Peds  Hematology   Anesthesia Other Findings   Reproductive/Obstetrics                           Anesthesia Physical Anesthesia Plan  ASA: III  Anesthesia Plan: General   Post-op Pain Management:    Induction: Intravenous  Airway Management Planned: LMA  Additional Equipment:   Intra-op Plan:   Post-operative Plan:   Informed Consent: I have reviewed the patients History and Physical, chart, labs and discussed the procedure including the risks, benefits and alternatives for the proposed anesthesia with the patient or authorized representative who has indicated his/her understanding and acceptance.   Dental advisory given  Plan Discussed with: CRNA and Anesthesiologist  Anesthesia Plan Comments: (Hypertension never treated  L ACL tear  Poor dentition   Plan GA with LMA and adductor canal block)        Anesthesia Quick Evaluation

## 2014-01-28 NOTE — H&P (Signed)
Derek Brady is an 38 y.o. male.   Chief Complaint: Left knee pain FYB:OFBP Derek Brady is a 38 year old patient with left knee pain. I reviewed his old records from his date of injury which is 11/24/2013. He initially injured his left knee at work when he stepped into a hole. A hole was covered by PCR white count more. He is twisting injury to the knee with immediate swelling and has had some pain and swelling in the knee since that time. He reports popping clicking and giving way. 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 He has been taking Aleve which helps. He does not do much in terms of outside physical activities other than he does go to church a lot. No family history of pulmonary embolism or deep vein thrombosis. No prior history of injury to the knee.  Past Medical History  Diagnosis Date  . Hypertension     BP elevated because of pain    Past Surgical History  Procedure Laterality Date  . Hernia repair      umbilical as a child    History reviewed. No pertinent family history. Social History:  reports that he has never smoked. He has never used smokeless tobacco. He reports that he does not drink alcohol or use illicit drugs.  Allergies: No Known Allergies  Medications Prior to Admission  Medication Sig Dispense Refill  . naproxen sodium (ANAPROX) 220 MG tablet Take 440 mg by mouth 2 (two) times daily as needed (for pain).        Results for orders placed during the hospital encounter of 01/27/14 (from the past 48 hour(s))  BASIC METABOLIC PANEL     Status: None   Collection Time    01/27/14 12:43 PM      Result Value Ref Range   Sodium 140  137 - 147 mEq/L   Potassium 4.4  3.7 - 5.3 mEq/L   Chloride 104  96 - 112 mEq/L   CO2 25  19 - 32 mEq/L   Glucose, Bld 87  70 - 99 mg/dL   BUN 13  6 - 23 mg/dL   Creatinine, Ser 0.96  0.50 - 1.35 mg/dL   Calcium 9.2  8.4  - 10.5 mg/dL   GFR calc non Af Amer >90  >90 mL/min   GFR calc Af Amer >90  >90 mL/min   Comment: (NOTE)     The eGFR has been calculated using the CKD EPI equation.     This calculation has not been validated in all clinical situations.     eGFR's persistently <90 mL/min signify possible Chronic Kidney     Disease.   Anion gap 11  5 - 15  CBC     Status: None   Collection Time    01/27/14 12:43 PM      Result Value Ref Range   WBC 7.1  4.0 - 10.5 K/uL   RBC 5.06  4.22 - 5.81 MIL/uL   Hemoglobin 14.6  13.0 - 17.0 g/dL   HCT 43.2  39.0 - 52.0 %   MCV 85.4  78.0 - 100.0 fL   MCH 28.9  26.0 - 34.0 pg   MCHC 33.8  30.0 - 36.0 g/dL   RDW 13.3  11.5 - 15.5 %   Platelets 237  150 - 400 K/uL   No results found.  Review of Systems  Constitutional:  Negative.   HENT: Negative.   Eyes: Negative.   Respiratory: Negative.   Cardiovascular: Negative.   Gastrointestinal: Negative.   Genitourinary: Negative.   Musculoskeletal: Positive for joint pain.  Skin: Negative.   Neurological: Negative.   Endo/Heme/Allergies: Negative.   Psychiatric/Behavioral: Negative.     Blood pressure 149/113, pulse 66, temperature 97.6 F (36.4 C), temperature source Oral, resp. rate 20, height 6' 2.5" (1.892 m), weight 117.028 kg (258 lb), SpO2 97.00%. Physical Exam  Constitutional: He appears well-developed.  HENT:  Head: Normocephalic.  Eyes: Pupils are equal, round, and reactive to light.  Neck: Normal range of motion.  Cardiovascular: Normal rate.   Respiratory: Effort normal.  Neurological: He is alert.  Skin: Skin is warm.  Psychiatric: He has a normal mood and affect.   on examination the patient's well-developed well-nourished no acute distress alert and oriented his hands do have a bit of flexion contracture in the MCP and PIP joints of his wrists elbows shoulders have good range of motion. He has palpable radial pulses bilaterally. He has palpable pedal pulses and good dorsiflexion  plantarflexion strength in both feet. He has about a 10 flexion contracture on the left knee compared to the right effusion is present. Anterior cruciate ligament laxity is present on the left knee. MCL LCL intact to varus and valgus testing at 0 and 30. On the left knee there is no posterior lateral rotatory instability. PCL is intact. Skin is intact in the left knee region  Assessment/Plan Impression is left knee anterior cruciate ligament tear with bucket handle tear the medial meniscus without fragment flipped into the intercondylar notch plan patient needs anterior cruciate ligament reconstruction and meniscal resection versus repair. I refer per to repair this the scope and a 38 year old patient is not a smoker really does not have any other medical problems other than potentially untreated hypertension. Plan is to look at the meniscus and examination at the time of surgery to determine its repairability. Hamstring autograft reconstruction is planned for anterior cruciate ligament reconstruction the risk and benefits discussed with the patient including but not limited to infection nerve vessel damage knee stiffness which I think in the setting of meniscal repair is likely more likely scenario. Counseled patient extensively about the need to increase his range of motion particularly extension following surgery. I think right now he has a blocked extension based on but the anterior cruciate ligament stump as well as the meniscal tear. Nonetheless I would favor an attempt to repair the meniscus along with anterior cruciate ligament reconstruction using autograft hamstring. Patient understands risk and benefits and wishes to proceed. Anticipate out of work time in order to 3 months. All questions answered.  Linsy Ehresman SCOTT 01/28/2014, 7:19 AM

## 2014-01-28 NOTE — Progress Notes (Signed)
Notified Dr. Boneta LucksMasagee of patient's blood pressure. Orders to get ekg and to notify Dr. Noreene LarssonJoslin of results. Dr. Noreene LarssonJoslin in patient's room reviewing ekg  and talking to patient. Informed him of patient's blood pressure on arrival and during PAT visit.

## 2014-01-29 ENCOUNTER — Encounter (HOSPITAL_COMMUNITY): Payer: Self-pay | Admitting: General Practice

## 2014-01-29 DIAGNOSIS — S83512A Sprain of anterior cruciate ligament of left knee, initial encounter: Secondary | ICD-10-CM | POA: Diagnosis not present

## 2014-01-29 LAB — BASIC METABOLIC PANEL
Anion gap: 13 (ref 5–15)
BUN: 14 mg/dL (ref 6–23)
CO2: 23 mEq/L (ref 19–32)
CREATININE: 0.94 mg/dL (ref 0.50–1.35)
Calcium: 8.5 mg/dL (ref 8.4–10.5)
Chloride: 102 mEq/L (ref 96–112)
GFR calc Af Amer: >90 mL/min (ref >90)
GLUCOSE: 113 mg/dL — AB (ref 70–99)
POTASSIUM: 4.2 meq/L (ref 3.7–5.3)
Sodium: 138 mEq/L (ref 137–147)

## 2014-01-29 MED ORDER — METHOCARBAMOL 500 MG PO TABS: 500.0000 mg | ORAL_TABLET | Freq: Four times a day (QID) | ORAL | Status: AC | PRN

## 2014-01-29 MED ORDER — OXYCODONE HCL 5 MG PO TABS: 5.0000 mg | ORAL_TABLET | ORAL | Status: AC | PRN

## 2014-01-29 MED ORDER — ACETAMINOPHEN 325 MG PO TABS
325.0000 mg | ORAL_TABLET | ORAL | Status: DC | PRN
  Administered 2014-01-29: 325 mg via ORAL
  Filled 2014-01-29: qty 1

## 2014-01-29 MED ORDER — ASPIRIN 325 MG PO TABS: 325.0000 mg | ORAL_TABLET | Freq: Every day | ORAL | Status: AC

## 2014-01-29 MED ORDER — AMLODIPINE BESYLATE 5 MG PO TABS: 5.0000 mg | ORAL_TABLET | Freq: Every day | ORAL | Status: AC

## 2014-01-29 NOTE — Care Management Note (Signed)
CARE MANAGEMENT NOTE 01/29/2014  Patient:  Derek Brady,Derek Brady   Account Number:  [id number]  Date Initiated:  01/29/2014  Documentation initiated by:  Vance PeperBRADY,Eragon Hammond  Subjective/Objective Assessment:   38 yr old male admitted with Left knee ACL tear, Medial meniscal tear. patient underwent ACL tear reconstruction.     Action/Plan:   Patient has no home Health needs. DME  orders faxed to [employer] [phone]. Lamar LaundryMichael Escudero is adjuster: [phone] QIONG#[id number]#[id number]   Anticipated DC Date:  01/29/2014   Anticipated DC Plan:  HOME/SELF CARE      DC Planning Services  CM consult      PAC Choice  DURABLE MEDICAL EQUIPMENT   Choice offered to / List presented to:     DME arranged  WALKER - ROLLING  3-N-1      DME agency  [employer]        HH agency  NA   Status of service:   Medicare Important Message given?   (If response is "NO", the following Medicare IM given date fields will be blank) Date Medicare IM given:   Medicare IM given by:   Date Additional Medicare IM given:   Additional Medicare IM given by:    Discharge Disposition:  HOME/SELF CARE  Per UR Regulation:  Reviewed for med. necessity/level of care/duration of stay  If discussed at Long Length of Stay Meetings, dates discussed:    Comments:  01/29/14 1600 Vance PeperSusan Massey Ruhland, RN BSN Casew Manager CM spoke with Kipp BroodBrent with [employer]. He will bring patient a rolling walker.

## 2014-01-29 NOTE — Consult Note (Signed)
[facility] PROGRESS NOTE  Derek CradleGary Brady ZOX:[id number] DOB: 07/21/1975 DOA: 01/28/2014 PCP: Pcp Not In System  Assessment/Plan: 1. Probable HTN vs elevated BP in the setting of surgery, pain; started on amlodipine; continue outpatient follow up with PCP  2. Left Knee ACL tear , medial meniscal tear ; SP surgery on 10/15. DVT prophylaxis per ortho.    [facility] will sign off at this time; please call with questions   Code Status: full Family Communication: d/w patient (indicate person spoken with, relationship, and if by phone, the number) Disposition Plan: home per primary   Consultants:  ortho  Procedures:    Antibiotics:   (indicate start date, and stop date if known)  HPI/Subjective: alert  Objective: Filed Vitals:   01/29/14 0554  BP: 143/83  Pulse: 90  Temp: 99.5 F (37.5 C)  Resp: 17    Intake/Output Summary (Last 24 hours) at 01/29/14 1058 Last data filed at 01/29/14 0700  Gross per 24 hour  Intake 1971.25 ml  Output    650 ml  Net 1321.25 ml   Filed Weights   01/28/14 0550  Weight: 117.028 kg (258 lb)    Exam:   General:  alert  Cardiovascular: s1,s2 rrr  Respiratory: CTA BL  Abdomen: soft, nt,nd   Musculoskeletal: post op   Data Reviewed: Basic Metabolic Panel:  Recent Labs Lab 01/27/14 1243 01/29/14 0431  NA 140 138  K 4.4 4.2  CL 104 102  CO2 25 23  GLUCOSE 87 113*  BUN 13 14  CREATININE 0.96 0.94  CALCIUM 9.2 8.5   Liver Function Tests: No results found for this basename: AST, ALT, ALKPHOS, BILITOT, PROT, ALBUMIN,  in the last 168 hours No results found for this basename: LIPASE, AMYLASE,  in the last 168 hours No results found for this basename: AMMONIA,  in the last 168 hours CBC:  Recent Labs Lab 01/27/14 1243  WBC 7.1  HGB 14.6  HCT 43.2  MCV 85.4  PLT 237   Cardiac Enzymes: No results found for this basename: CKTOTAL, CKMB, CKMBINDEX, TROPONINI,  in the last 168 hours BNP (last 3 results) No results  found for this basename: PROBNP,  in the last 8760 hours CBG: No results found for this basename: GLUCAP,  in the last 168 hours  No results found for this or any previous visit (from the past 240 hour(s)).   Studies: No results found.  Scheduled Meds: . amLODipine  5 mg Oral Daily  . aspirin  325 mg Oral Daily   Continuous Infusions: . 0.9 % NaCl with KCl 20 mEq / L 75 mL/hr at 01/29/14 0447    Active Problems:   ACL tear   HTN (hypertension), benign    Time spent: >35 minutes     Derek Brady, Derek Brady  [facility] Pager [phone]. If 7PM-7AM, please contact night-coverage at [email or web address], password [email or web address] 01/29/2014, 10:58 AM  LOS: 1 day

## 2014-01-29 NOTE — Progress Notes (Signed)
UR completed 

## 2014-01-29 NOTE — Progress Notes (Signed)
Physical Therapy Treatment Patient Details Name: Derek Brady MRN: [id number] DOB: 06/14/1975 Today's Date: 01/29/2014    History of Present Illness 38 y.o. male s/p LEFT KNEE ARTHROSCOPY, ANTERIOR CRUCIATE LIGAMENT RECONSTRUCTION WITH HAMSTRING AUTOGRAFT, MEDIAL MENISCAL REPAIR VERSES DEBRIDEMENT.  hx of HTN.    PT Comments    Patient continues to progress well towards physical therapy goals, ambulating up to 100 feet with supervision while using a rolling walker and safely maintaining 50% weight-bearing status on LLE. Safely completed stair training this AM and feel he is adequate for d/c from a mobility standpoint when medically ready.   Follow Up Recommendations  Supervision for mobility/OOB     Equipment Recommendations  Rolling walker with 5" wheels;3in1 (PT)    Recommendations for Other Services OT consult     Precautions / Restrictions Precautions Precautions: Knee Precaution Comments: Reviewed knee precautions Required Braces or Orthoses: Knee Immobilizer - Left Restrictions Weight Bearing Restrictions: Yes LLE Weight Bearing: Partial weight bearing LLE Partial Weight Bearing Percentage or Pounds: 50%    Mobility  Bed Mobility Overal bed mobility: Needs Assistance Bed Mobility: Supine to Sit     Supine to sit: Supervision     General bed mobility comments: Supervision for safety. No physical assist required  Transfers Overall transfer level: Needs assistance Equipment used: Rolling walker (2 wheeled) Transfers: Sit to/from Stand Sit to Stand: From elevated surface;Supervision         General transfer comment: Supervision for safety with VC for hand placement. Performed from recliner x2 and slightly elevated bed surface. Good stability upon standing and maintains 50% WB status on LLE.  Ambulation/Gait Ambulation/Gait assistance: Supervision Ambulation Distance (Feet): 100 Feet Assistive device: Rolling walker (2 wheeled) Gait Pattern/deviations:  Step-to pattern;Decreased step length - right;Decreased stance time - left;Antalgic   Gait velocity interpretation: Below normal speed for age/gender General Gait Details: Slow and guarded gait. Maintains 50% weight bearing status on LLE at all times. VC for sequencing and walker placement. No loss of balance. Heavy use of RW and encouraged to allow some weight through LLE.   Stairs Stairs: Yes Stairs assistance: Min assist Stair Management: No rails;Step to pattern;Backwards;With walker Number of Stairs: 2 (x2) General stair comments: Demonstrated to patient prior to having him practice. VC for sequencing and technique. Min assist to block walker only. Pt able to correctly teach back on second trail.  Wheelchair Mobility    Modified Rankin (Stroke Patients Only)       Balance                                    Cognition Arousal/Alertness: Awake/alert Behavior During Therapy: WFL for tasks assessed/performed Overall Cognitive Status: Within Functional Limits for tasks assessed                      Exercises General Exercises - Lower Extremity Ankle Circles/Pumps: AROM;Both;10 reps;Seated Quad Sets: Strengthening;Left;10 reps;Seated    General Comments General comments (skin integrity, edema, etc.): Reviewed precautions, safe positioning, use of knee immobilizer, and d/c recommendations.      Pertinent Vitals/Pain Pain Assessment: 0-10 Pain Score: 3  Pain Location: Lt knee Pain Intervention(s): Monitored during session;Repositioned;Patient requesting pain meds-RN notified    Home Living                      Prior Function  PT Goals (current goals can now be found in the care plan section) Acute Rehab PT Goals PT Goal Formulation: With patient Time For Goal Achievement: 02/04/14 Potential to Achieve Goals: Good Progress towards PT goals: Progressing toward goals    Frequency  Min 5X/week    PT Plan Current plan  remains appropriate    Co-evaluation             End of Session Equipment Utilized During Treatment: Gait belt;Left knee immobilizer Activity Tolerance: Patient tolerated treatment well Patient left: with call bell/phone within reach;in chair     Time: [id number] PT Time Calculation (min): 40 min  Charges:  $Gait Training: 8-22 mins $Therapeutic Activity: 8-22 mins $Self Care/Home Management: 8-22                    G Codes:      Charlsie MerlesLogan Secor Majid Mccravy, [place]PT [phone]  Berton MountBarbour, Velora Horstman S 01/29/2014, 10:01 AM

## 2014-01-29 NOTE — Progress Notes (Signed)
Pt stable Left foot perfused mobile sensate Dc today after PT and CPM

## 2014-01-29 NOTE — Progress Notes (Addendum)
Pt has been waiting on walker to arrive now states he has no one to pick him up until am walker in rm  Dr Roda ShuttersXu paged.Pt's temp now 101 enc IS flds Dr Roda ShuttersXu ordered tylenol 325mg  given,

## 2014-01-30 DIAGNOSIS — S83512A Sprain of anterior cruciate ligament of left knee, initial encounter: Secondary | ICD-10-CM | POA: Diagnosis not present

## 2014-01-30 NOTE — Progress Notes (Signed)
Dc home today Orders written  N. Glee ArvinMichael Xu, MD [facility] [phone] 9:18 AM

## 2014-01-30 NOTE — Progress Notes (Signed)
PT Cancellation Note  Patient Details Name: Derek CradleGary Brady MRN: [id number] DOB: 10/11/1975   Cancelled Treatment:    Reason Eval/Treat Not Completed: Other (comment) (stated he was tired)   [name], [name] 01/30/2014, 3:04 PM [name], PT MS Acute Rehab Dept. Number: [phone]

## 2014-02-01 NOTE — Discharge Summary (Signed)
Physician Discharge Summary  Patient ID: Derek Brady MRN: [id number] DOB/AGE: 38/09/1975 38 y.o.  Admit date: 01/28/2014 Discharge date: 01/29/2014 Admission Diagnoses:  Active Problems:   ACL tear   HTN (hypertension), benign   Discharge Diagnoses:  Same  Surgeries: Procedure(s): LEFT KNEE ARTHROSCOPY, ANTERIOR CRUCIATE LIGAMENT RECONSTRUCTION WITH HAMSTRING AUTOGRAFT, MEDIAL MENISCAL REPAIR VERSES DEBRIDEMENT. on 01/28/2014   Consultants:    Discharged Condition: Stable  Hospital Course: Derek Brady is an 38 y.o. male who was admitted 01/28/2014 with a chief complaint of knee pain, and found to have a diagnosis of acl tear and medial meniscal tear.  They were brought to the operating room on 01/28/2014 and underwent the above named procedures.He was started pwb and was seen for htn by med consultant. - Will start CPM at home and progress as tolerated    Antibiotics given:  Anti-infectives   Start     Dose/Rate Route Frequency Ordered Stop   01/28/14 1345  ceFAZolin (ANCEF) IVPB 2 g/50 mL premix     2 g 100 mL/hr over 30 Minutes Intravenous Every 6 hours 01/28/14 1340 01/29/14 0145   01/28/14 0600  ceFAZolin (ANCEF) IVPB 2 g/50 mL premix     2 g 100 mL/hr over 30 Minutes Intravenous On call to O.R. 01/27/14 1413 01/28/14 0747    .  Recent vital signs:  Filed Vitals:   01/30/14 0506  BP: 146/87  Pulse: 102  Temp: 100.4 F (38 C)  Resp: 18    Recent laboratory studies:  Results for orders placed during the hospital encounter of 01/28/14  BASIC METABOLIC PANEL      Result Value Ref Range   Sodium 138  137 - 147 mEq/L   Potassium 4.2  3.7 - 5.3 mEq/L   Chloride 102  96 - 112 mEq/L   CO2 23  19 - 32 mEq/L   Glucose, Bld 113 (*) 70 - 99 mg/dL   BUN 14  6 - 23 mg/dL   Creatinine, Ser 9.520.94  0.50 - 1.35 mg/dL   Calcium 8.5  8.4 - 84.110.5 mg/dL   GFR calc non Af Amer >90  >90 mL/min   GFR calc Af Amer >90  >90 mL/min   Anion gap 13  5 - 15    Discharge Medications:      Medication List    STOP taking these medications       naproxen sodium 220 MG tablet  Commonly known as:  ANAPROX      TAKE these medications       amLODipine 5 MG tablet  Commonly known as:  NORVASC  Take 1 tablet (5 mg total) by mouth daily.     aspirin 325 MG tablet  Take 1 tablet (325 mg total) by mouth daily.     methocarbamol 500 MG tablet  Commonly known as:  ROBAXIN  Take 1 tablet (500 mg total) by mouth every 6 (six) hours as needed for muscle spasms.     oxyCODONE 5 MG immediate release tablet  Commonly known as:  Oxy IR/ROXICODONE  Take 1-2 tablets (5-10 mg total) by mouth every 3 (three) hours as needed for breakthrough pain.        Diagnostic Studies: No results found.  Disposition: 01-Home or Self Care      Discharge Instructions   Call MD / Call 911    Complete by:  As directed   If you experience chest pain or shortness of breath, CALL 911 and be transported to the  hospital emergency room.  If you develope a fever above 101 F, pus (white drainage) or increased drainage or redness at the wound, or calf pain, call your surgeon's office.     Call MD / Call 911    Complete by:  As directed   If you experience chest pain or shortness of breath, CALL 911 and be transported to the hospital emergency room.  If you develope a fever above 101.5 F, pus (white drainage) or increased drainage or redness at the wound, or calf pain, call your surgeon's office.     Constipation Prevention    Complete by:  As directed   Drink plenty of fluids.  Prune juice may be helpful.  You may use a stool softener, such as Colace (over the counter) 100 mg twice a day.  Use MiraLax (over the counter) for constipation as needed.     Constipation Prevention    Complete by:  As directed   Drink plenty of fluids.  Prune juice may be helpful.  You may use a stool softener, such as Colace (over the counter) 100 mg twice a day.  Use MiraLax (over the counter) for constipation as needed.      Diet - low sodium heart healthy    Complete by:  As directed      Diet - low sodium heart healthy    Complete by:  As directed      Diet general    Complete by:  As directed      Discharge instructions    Complete by:  As directed   OK to weight bear 50 percent left leg CPM 2 hours per 8 for 6 hours per day total Always keep pillow under heel to work on full extension - straight leg Change dressing tomorrow - keep incisions dry     Driving restrictions    Complete by:  As directed   No driving while taking narcotic pain meds.     Increase activity slowly as tolerated    Complete by:  As directed      Increase activity slowly as tolerated    Complete by:  As directed               Signed: DEAN,GREGORY SCOTT 02/01/2014, 8:11 AM

## 2014-02-02 ENCOUNTER — Encounter (HOSPITAL_COMMUNITY): Payer: Self-pay | Admitting: Orthopedic Surgery
